# Patient Record
Sex: Male | Born: 1980 | Race: White | Hispanic: No | Marital: Married | Smoking: Never smoker
Health system: Southern US, Community
[De-identification: ages and names within clinical notes are randomized; demographics above are authoritative.]

## PROBLEM LIST (undated history)

## (undated) DIAGNOSIS — K219 Gastro-esophageal reflux disease without esophagitis: Secondary | ICD-10-CM

## (undated) DIAGNOSIS — E785 Hyperlipidemia, unspecified: Secondary | ICD-10-CM

## (undated) HISTORY — PX: STAPEDECTOMY: SHX2435

## (undated) HISTORY — DX: Hyperlipidemia, unspecified: E78.5

## (undated) HISTORY — DX: Gastro-esophageal reflux disease without esophagitis: K21.9

## (undated) HISTORY — PX: HERNIA REPAIR: SHX51

## (undated) HISTORY — PX: WISDOM TOOTH EXTRACTION: SHX21

---

## 2017-10-17 ENCOUNTER — Ambulatory Visit: Payer: Self-pay | Admitting: Family Medicine

## 2017-12-13 DIAGNOSIS — H73892 Other specified disorders of tympanic membrane, left ear: Secondary | ICD-10-CM | POA: Insufficient documentation

## 2017-12-13 DIAGNOSIS — H9012 Conductive hearing loss, unilateral, left ear, with unrestricted hearing on the contralateral side: Secondary | ICD-10-CM | POA: Insufficient documentation

## 2017-12-13 HISTORY — DX: Other specified disorders of tympanic membrane, left ear: H73.892

## 2017-12-13 HISTORY — DX: Conductive hearing loss, unilateral, left ear, with unrestricted hearing on the contralateral side: H90.12

## 2019-02-08 DIAGNOSIS — H9072 Mixed conductive and sensorineural hearing loss, unilateral, left ear, with unrestricted hearing on the contralateral side: Secondary | ICD-10-CM

## 2019-02-08 HISTORY — DX: Mixed conductive and sensorineural hearing loss, unilateral, left ear, with unrestricted hearing on the contralateral side: H90.72

## 2019-09-04 ENCOUNTER — Other Ambulatory Visit: Payer: Self-pay

## 2019-09-05 ENCOUNTER — Ambulatory Visit (INDEPENDENT_AMBULATORY_CARE_PROVIDER_SITE_OTHER): Payer: No Typology Code available for payment source

## 2019-09-05 ENCOUNTER — Encounter: Payer: Self-pay | Admitting: Cardiology

## 2019-09-05 ENCOUNTER — Other Ambulatory Visit: Payer: Self-pay

## 2019-09-05 ENCOUNTER — Ambulatory Visit (INDEPENDENT_AMBULATORY_CARE_PROVIDER_SITE_OTHER): Payer: No Typology Code available for payment source | Admitting: Cardiology

## 2019-09-05 VITALS — BP 126/90 | HR 94 | Ht 68.0 in | Wt 188.0 lb

## 2019-09-05 DIAGNOSIS — R42 Dizziness and giddiness: Secondary | ICD-10-CM

## 2019-09-05 DIAGNOSIS — E782 Mixed hyperlipidemia: Secondary | ICD-10-CM

## 2019-09-05 DIAGNOSIS — I498 Other specified cardiac arrhythmias: Secondary | ICD-10-CM

## 2019-09-05 DIAGNOSIS — R002 Palpitations: Secondary | ICD-10-CM

## 2019-09-05 DIAGNOSIS — R079 Chest pain, unspecified: Secondary | ICD-10-CM

## 2019-09-05 MED ORDER — METOPROLOL TARTRATE 100 MG PO TABS
100.0000 mg | ORAL_TABLET | Freq: Once | ORAL | 0 refills | Status: DC
Start: 2019-09-05 — End: 2019-10-03

## 2019-09-05 NOTE — Patient Instructions (Signed)
Medication Instructions:  Your physician recommends that you continue on your current medications as directed. Please refer to the Current Medication list given to you today.  *If you need a refill on your cardiac medications before your next appointment, please call your pharmacy*   Lab Work: None.  If you have labs (blood work) drawn today and your tests are completely normal, you will receive your results only by: Marland Kitchen MyChart Message (if you have MyChart) OR . A paper copy in the mail If you have any lab test that is abnormal or we need to change your treatment, we will call you to review the results.   Testing/Procedures: Your physician has requested that you have an echocardiogram. Echocardiography is a painless test that uses sound waves to create images of your heart. It provides your doctor with information about the size and shape of your heart and how well your heart's chambers and valves are working. This procedure takes approximately one hour. There are no restrictions for this procedure.  A zio monitor was ordered today. It will remain on for 14 days. You will then return monitor and event diary in provided box. It takes 1-2 weeks for report to be downloaded and returned to [place]. We will call you with the results. If monitor falls off or has orange flashing light, please call Zio for further instructions.   Your cardiac CT will be scheduled at one of the below locations:   [facility] [address] [place], [place] [address] [phone]  OR  [facility] [address] [place], [place] [address] [phone]  If scheduled at [facility], please arrive at the [facility] main entrance of [facility] 30 minutes prior to test start time. Proceed to the [facility] Radiology Department (first floor) to check-in and test prep.  If scheduled at [facility], please arrive 15  mins early for check-in and test prep.  Please follow these instructions carefully (unless otherwise directed):  Hold all erectile dysfunction medications at least 3 days (72 hrs) prior to test.  On the Night Before the Test: . Be sure to Drink plenty of water. . Do not consume any caffeinated/decaffeinated beverages or chocolate 12 hours prior to your test. . Do not take any antihistamines 12 hours prior to your test.   On the Day of the Test: . Drink plenty of water. Do not drink any water within one hour of the test. . Do not eat any food 4 hours prior to the test. . You may take your regular medications prior to the test.  . Take metoprolol (Lopressor) two hours prior to test. .  .          -Drink plenty of water       -Take metoprolol (Lopressor) 2 hours prior to test (if applicable).                        After the Test: . Drink plenty of water. . After receiving IV contrast, you may experience a mild flushed feeling. This is normal. . On occasion, you may experience a mild rash up to 24 hours after the test. This is not dangerous. If this occurs, you can take Benadryl 25 mg and increase your fluid intake. . If you experience trouble breathing, this can be serious. If it is severe call 911 IMMEDIATELY. If it is mild, please call our office. Marland Kitchen  If you take any of these medications: Glipizide/Metformin, Avandament, Glucavance, please do not take 48 hours after completing test unless otherwise instructed.   Once we have confirmed authorization from your insurance company, we will call you to set up a date and time for your test.   For non-scheduling related questions, please contact the cardiac imaging nurse navigator should you have any questions/concerns: Marchia Bond, RN Navigator Cardiac Imaging [facility] [phone] office  For scheduling needs, including cancellations and rescheduling, please call [phone].      Follow-Up: At  [facility], you and your health needs are our priority.  As part of our continuing mission to provide you with exceptional heart care, we have created designated Provider Care Teams.  These Care Teams include your primary Cardiologist (physician) and Advanced Practice Providers (APPs -  Physician Assistants and Nurse Practitioners) who all work together to provide you with the care you need, when you need it.  We recommend signing up for the patient portal called "MyChart".  Sign up information is provided on this After Visit Summary.  MyChart is used to connect with patients for Virtual Visits (Telemedicine).  Patients are able to view lab/test results, encounter notes, upcoming appointments, etc.  Non-urgent messages can be sent to your provider as well.   To learn more about what you can do with MyChart, go to [email or web address].    Your next appointment:   3 month(s)  The format for your next appointment:   In Person  Provider:   Berniece Salines, DO   Other Instructions   Cardiac CT Angiogram A cardiac CT angiogram is a procedure to look at the heart and the area around the heart. It may be done to help find the cause of chest pains or other symptoms of heart disease. During this procedure, a substance called contrast dye is injected into the blood vessels in the area to be checked. A large X-ray machine, called a CT scanner, then takes detailed pictures of the heart and the surrounding area. The procedure is also sometimes called a coronary CT angiogram, coronary artery scanning, or CTA. A cardiac CT angiogram allows the health care provider to see how well blood is flowing to and from the heart. The health care provider will be able to see if there are any problems, such as:  Blockage or narrowing of the coronary arteries in the heart.  Fluid around the heart.  Signs of weakness or disease in the muscles, valves, and tissues of the heart. Tell a health care provider  about:  Any allergies you have. This is especially important if you have had a previous allergic reaction to contrast dye.  All medicines you are taking, including vitamins, herbs, eye drops, creams, and over-the-counter medicines.  Any blood disorders you have.  Any surgeries you have had.  Any medical conditions you have.  Whether you are pregnant or may be pregnant.  Any anxiety disorders, chronic pain, or other conditions you have that may increase your stress or prevent you from lying still. What are the risks? Generally, this is a safe procedure. However, problems may occur, including:  Bleeding.  Infection.  Allergic reactions to medicines or dyes.  Damage to other structures or organs.  Kidney damage from the contrast dye that is used.  Increased risk of cancer from radiation exposure. This risk is low. Talk with your health care provider about: ? The risks and benefits of testing. ? How you can receive the  lowest dose of radiation. What happens before the procedure?  Wear comfortable clothing and remove any jewelry, glasses, dentures, and hearing aids.  Follow instructions from your health care provider about eating and drinking. This may include: ? For 12 hours before the procedure -- avoid caffeine. This includes tea, coffee, soda, energy drinks, and diet pills. Drink plenty of water or other fluids that do not have caffeine in them. Being well hydrated can prevent complications. ? For 4-6 hours before the procedure -- stop eating and drinking. The contrast dye can cause nausea, but this is less likely if your stomach is empty.  Ask your health care provider about changing or stopping your regular medicines. This is especially important if you are taking diabetes medicines, blood thinners, or medicines to treat problems with erections (erectile dysfunction). What happens during the procedure?   Hair on your chest may need to be removed so that small sticky  patches called electrodes can be placed on your chest. These will transmit information that helps to monitor your heart during the procedure.  An IV will be inserted into one of your veins.  You might be given a medicine to control your heart rate during the procedure. This will help to ensure that good images are obtained.  You will be asked to lie on an exam table. This table will slide in and out of the CT machine during the procedure.  Contrast dye will be injected into the IV. You might feel warm, or you may get a metallic taste in your mouth.  You will be given a medicine called nitroglycerin. This will relax or dilate the arteries in your heart.  The table that you are lying on will move into the CT machine tunnel for the scan.  The person running the machine will give you instructions while the scans are being done. You may be asked to: ? Keep your arms above your head. ? Hold your breath. ? Stay very still, even if the table is moving.  When the scanning is complete, you will be moved out of the machine.  The IV will be removed. The procedure may vary among health care providers and hospitals. What can I expect after the procedure? After your procedure, it is common to have:  A metallic taste in your mouth from the contrast dye.  A feeling of warmth.  A headache from the nitroglycerin. Follow these instructions at home:  Take over-the-counter and prescription medicines only as told by your health care provider.  If you are told, drink enough fluid to keep your urine pale yellow. This will help to flush the contrast dye out of your body.  Most people can return to their normal activities right after the procedure. Ask your health care provider what activities are safe for you.  It is up to you to get the results of your procedure. Ask your health care provider, or the department that is doing the procedure, when your results will be ready.  Keep all follow-up visits  as told by your health care provider. This is important. Contact a health care provider if:  You have any symptoms of allergy to the contrast dye. These include: ? Shortness of breath. ? Rash or hives. ? A racing heartbeat. Summary  A cardiac CT angiogram is a procedure to look at the heart and the area around the heart. It may be done to help find the cause of chest pains or other symptoms of heart disease.  During  this procedure, a large X-ray machine, called a CT scanner, takes detailed pictures of the heart and the surrounding area after a contrast dye has been injected into blood vessels in the area.  Ask your health care provider about changing or stopping your regular medicines before the procedure. This is especially important if you are taking diabetes medicines, blood thinners, or medicines to treat erectile dysfunction.  If you are told, drink enough fluid to keep your urine pale yellow. This will help to flush the contrast dye out of your body. This information is not intended to replace advice given to you by your health care provider. Make sure you discuss any questions you have with your health care provider. Document Revised: 12/20/2018 Document Reviewed: 12/20/2018 Elsevier Patient Education  2020 [employer].   Echocardiogram An echocardiogram is a procedure that uses painless sound waves (ultrasound) to produce an image of the heart. Images from an echocardiogram can provide important information about:  Signs of coronary artery disease (CAD).  Aneurysm detection. An aneurysm is a weak or damaged part of an artery wall that bulges out from the normal force of blood pumping through the body.  Heart size and shape. Changes in the size or shape of the heart can be associated with certain conditions, including heart failure, aneurysm, and CAD.  Heart muscle function.  Heart valve function.  Signs of a past heart attack.  Fluid buildup around the  heart.  Thickening of the heart muscle.  A tumor or infectious growth around the heart valves. Tell a health care provider about:  Any allergies you have.  All medicines you are taking, including vitamins, herbs, eye drops, creams, and over-the-counter medicines.  Any blood disorders you have.  Any surgeries you have had.  Any medical conditions you have.  Whether you are pregnant or may be pregnant. What are the risks? Generally, this is a safe procedure. However, problems may occur, including:  Allergic reaction to dye (contrast) that may be used during the procedure. What happens before the procedure? No specific preparation is needed. You may eat and drink normally. What happens during the procedure?   An IV tube may be inserted into one of your veins.  You may receive contrast through this tube. A contrast is an injection that improves the quality of the pictures from your heart.  A gel will be applied to your chest.  A wand-like tool (transducer) will be moved over your chest. The gel will help to transmit the sound waves from the transducer.  The sound waves will harmlessly bounce off of your heart to allow the heart images to be captured in real-time motion. The images will be recorded on a computer. The procedure may vary among health care providers and hospitals. What happens after the procedure?  You may return to your normal, everyday life, including diet, activities, and medicines, unless your health care provider tells you not to do that. Summary  An echocardiogram is a procedure that uses painless sound waves (ultrasound) to produce an image of the heart.  Images from an echocardiogram can provide important information about the size and shape of your heart, heart muscle function, heart valve function, and fluid buildup around your heart.  You do not need to do anything to prepare before this procedure. You may eat and drink normally.  After the  echocardiogram is completed, you may return to your normal, everyday life, unless your health care provider tells you not to do that. This information  is not intended to replace advice given to you by your health care provider. Make sure you discuss any questions you have with your health care provider. Document Revised: 08/17/2018 Document Reviewed: 05/29/2016 Elsevier Patient Education  [employer].

## 2019-09-05 NOTE — Progress Notes (Signed)
Cardiology Office Note:    Date:  09/05/2019   ID:  Edward Lindsey, DOB 1980/11/07, MRN [id number]  PCP:  Joycelyn RuaMeyers, Stephen, MD  Cardiologist:  Thomasene RippleKardie Annabelle Rexroad, DO  Electrophysiologist:  None   Referring MD: Joycelyn RuaMeyers, Stephen, MD   Chief Complaint  Patient presents with  . New Patient (Initial Visit)   History of Present Illness:    Edward AidRobert Briner is a 39 y.o. male with a hx of hyperlipidemia, GERD, significant family history of premature coronary artery disease presents today to be evaluated for chest pain, palpitations and near syncope episodes.  The patient tells me for many years he has been experiencing episodes of near syncope/lightheadedness.  He describes it as a sensation of lightheadedness where he feels flushed.  He has never had any syncope/passing out events.  He notes that once this occurs it last for about 5 seconds and resolves.  However he tells me 1 week ago he had an episode which was significantly different.  Although he was lightheaded with a flush sensation this time it lasted longer and he did have feelings of significant palpitations.  He noted that the lightheadedness and palpitations went through all day and resolve later that evening.  He did check his blood pressure during that time and he reported that it was 120/80 mmHg with a heart rate of 106.  In addition, he has been expressing intermittent chest pain.  He described this as a left-sided chest tightness which radiates up to his neck, last for few seconds prior to resolution.  Nothing makes it better or worse.  He denies any associated shortness of breath.  He notes that is been occurring frequently recently.  The patient admits that he is under a lot of stress recently but is also concerned about his wellbeing.  Past Medical History:  Diagnosis Date  . Conductive hearing loss of left ear with unrestricted hearing of right ear 12/13/2017   Last Assessment & Plan:  Formatting of this note might be different from the  original. Concern over his left ear. 1 week history of slowly worsening hearing in the left ear.  No history of ear surgery, infection or trauma.  He had an audiogram done last year that is reviewed and shows a conductive hearing loss of severe nature primarily in the low frequencies.  Along with this, he gets an intermit  . GERD (gastroesophageal reflux disease)   . Hyperlipidemia   . 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 HERNIA REPAIR    . STAPEDECTOMY Left   . WISDOM TOOTH EXTRACTION      Current Medications: Current Meds  Medication Sig  . famotidine (PEPCID) 20 MG tablet Take 20 mg by mouth 2 (two) times daily.     Allergies:   Codeine   Social History   Socioeconomic History  . Marital status: Married    Spouse name: Not on file  . Number of children: Not on file  . Years of education: Not on file  . Highest education level: Not on file  Occupational History  . Not on file  Tobacco Use  . Smoking status: Never Smoker  . Smokeless tobacco: Never Used  Substance and Sexual Activity  . Alcohol use: Yes    Comment: Drinks alcohol daily  . Drug use: Not on file  .  Sexual activity: Not on file  Other Topics Concern  . Not on file  Social History Narrative  . Not on file   Social Determinants of Health   Financial Resource Strain:   . Difficulty of Paying Living Expenses:   Food Insecurity:   . Worried About Programme researcher, broadcasting/film/video in the Last Year:   . Barista in the Last Year:   Transportation Needs:   . Freight forwarder (Medical):   Marland Kitchen Lack of Transportation (Non-Medical):   Physical Activity:   . Days of Exercise per Week:   . Minutes of Exercise per Session:   Stress:   . Feeling of Stress :   Social Connections:   . Frequency of Communication with Friends  and Family:   . Frequency of Social Gatherings with Friends and Family:   . Attends Religious Services:   . Active Member of Clubs or Organizations:   . Attends Banker Meetings:   Marland Kitchen Marital Status:      Family History: The patient's family history includes Cancer in his father; Heart attack (age of onset: 26) in his maternal grandfather; Melanoma in his father.  ROS:   Review of Systems  Constitution: Negative for decreased appetite, fever and weight gain.  HENT: Negative for congestion, ear discharge, hoarse voice and sore throat.   Eyes: Negative for discharge, redness, vision loss in right eye and visual halos.  Cardiovascular: Reports chest pain, palpitations and lightheadedness.  Negative for  dyspnea on exertion, leg swelling, orthopnea.  Respiratory: Negative for cough, hemoptysis, shortness of breath and snoring.   Endocrine: Negative for heat intolerance and polyphagia.  Hematologic/Lymphatic: Negative for bleeding problem. Does not bruise/bleed easily.  Skin: Negative for flushing, nail changes, rash and suspicious lesions.  Musculoskeletal: Negative for arthritis, joint pain, muscle cramps, myalgias, neck pain and stiffness.  Gastrointestinal: Negative for abdominal pain, bowel incontinence, diarrhea and excessive appetite.  Genitourinary: Negative for decreased libido, genital sores and incomplete emptying.  Neurological: Negative for brief paralysis, focal weakness, headaches and loss of balance.  Psychiatric/Behavioral: Negative for altered mental status, depression and suicidal ideas.  Allergic/Immunologic: Negative for HIV exposure and persistent infections.    EKGs/Labs/Other Studies Reviewed:    The following studies were reviewed today:   EKG:  The ekg ordered today demonstrates sinus rhythm, heart rate 94 bpm with arrhythmia no EKG for comparison.  Recent Labs: Lab work done at his PCP on March 21, 2019 shows Hemoglobin A1c 5.1 Recent Lipid  Panel Lipid profile performed on April 02, 2019 shows total cholesterol 219, triglyceride 80, HDL 60, LDL 143  Physical Exam:    VS:  BP 126/90   Pulse 94   Ht 5\' 8"  (1.727 m)   Wt 188 lb (85.3 kg)   SpO2 99%   BMI 28.59 kg/m     Wt Readings from Last 3 Encounters:  09/05/19 188 lb (85.3 kg)     GEN: Well nourished, well developed in no acute distress HEENT: Normal NECK: No JVD; No carotid bruits LYMPHATICS: No lymphadenopathy CARDIAC: S1S2 noted,RRR, no murmurs, rubs, gallops RESPIRATORY:  Clear to auscultation without rales, wheezing or rhonchi  ABDOMEN: Soft, non-tender, non-distended, +bowel sounds, no guarding. EXTREMITIES: No edema, No cyanosis, no clubbing MUSCULOSKELETAL:  No deformity  SKIN: Warm and dry NEUROLOGIC:  Alert and oriented x 3, non-focal PSYCHIATRIC:  Normal affect, good insight  ASSESSMENT:    1. Chest pain of uncertain etiology   2. Palpitations   3.  Sinus arrhythmia   4. Mixed hyperlipidemia   5. Lightheadedness    PLAN:     1.  His chest pain is concerning given his family history of premature event and his grandfather at 6 as well as with his hyperlipidemia.  At this time I like to pursue an ischemic evaluation in this patient.  His ASCVD risk score is low however I do think he is a appropriate candidate for coronary CTA.  I have educated patient about this testing, he is agreeable to proceed with this testing.  He denies any IV contrast dye allergies.  2.  I would like to rule out a cardiovascular etiology of this palpitation, therefore at this time I would like to placed a zio patch for 14 days. In additon a transthoracic echocardiogram will be ordered to assess LV/RV function and any structural abnormalities. Once these testing have been performed amd reviewed further reccomendations will be made. For now, I do reccomend that the patient goes to the nearest ED if  symptoms recur.  3.  His EKG today show evidence of sinus arrhythmia-I did  educate the patient about why this occurs.  4.  In terms of his lightheadedness educated patient to do some physical cross legs maneuver hopefully will help with peripheral vascular resistance and improve his symptoms.  5.  In terms of his hyperlipidemia he prefers diet modification options.  I did explain to him that this may be familiar and he has noted that his diet is very healthy.  But for right now we will repeat the lipid profile at his next visit.  The patient is in agreement with the above plan. The patient left the office in stable condition.  The patient will follow up in 3 months or sooner if needed.   Medication Adjustments/Labs and Tests Ordered: Current medicines are reviewed at length with the patient today.  Concerns regarding medicines are outlined above.  Orders Placed This Encounter  Procedures  . CT CORONARY MORPH W/CTA COR W/SCORE W/CA W/CM &/OR WO/CM  . CT CORONARY FRACTIONAL FLOW RESERVE DATA PREP  . CT CORONARY FRACTIONAL FLOW RESERVE FLUID ANALYSIS  . LONG TERM MONITOR (3-14 DAYS)  . EKG 12-Lead  . ECHOCARDIOGRAM COMPLETE   Meds ordered this encounter  Medications  . metoprolol tartrate (LOPRESSOR) 100 MG tablet    Sig: Take 1 tablet (100 mg total) by mouth once for 1 dose. Take 2 hours before CT.    Dispense:  1 tablet    Refill:  0    Patient Instructions  Medication Instructions:  Your physician recommends that you continue on your current medications as directed. Please refer to the Current Medication list given to you today.  *If you need a refill on your cardiac medications before your next appointment, please call your pharmacy*   Lab Work: None.  If you have labs (blood work) drawn today and your tests are completely normal, you will receive your results only by: Marland Kitchen MyChart Message (if you have MyChart) OR . A paper copy in the mail If you have any lab test that is abnormal or we need to change your treatment, we will call you to review the  results.   Testing/Procedures: Your physician has requested that you have an echocardiogram. Echocardiography is a painless test that uses sound waves to create images of your heart. It provides your doctor with information about the size and shape of your heart and how well your heart's chambers and valves are working. This  procedure takes approximately one hour. There are no restrictions for this procedure.  A zio monitor was ordered today. It will remain on for 14 days. You will then return monitor and event diary in provided box. It takes 1-2 weeks for report to be downloaded and returned to [place]. We will call you with the results. If monitor falls off or has orange flashing light, please call Zio for further instructions.   Your cardiac CT will be scheduled at one of the below locations:   [facility] [address] [place], [place] [address] [phone]  OR  [facility] [address] [place], [place] [address] [phone]  If scheduled at [facility], please arrive at the [facility] main entrance of [facility] 30 minutes prior to test start time. Proceed to the [facility] Radiology Department (first floor) to check-in and test prep.  If scheduled at [facility], please arrive 15 mins early for check-in and test prep.  Please follow these instructions carefully (unless otherwise directed):  Hold all erectile dysfunction medications at least 3 days (72 hrs) prior to test.  On the Night Before the Test: . Be sure to Drink plenty of water. . Do not consume any caffeinated/decaffeinated beverages or chocolate 12 hours prior to your test. . Do not take any antihistamines 12 hours prior to your test.   On the Day of the Test: . Drink plenty of water. Do not drink any water within one hour of the test. . Do not eat any food 4 hours prior to the test. . You may take your  regular medications prior to the test.  . Take metoprolol (Lopressor) two hours prior to test. .  .          -Drink plenty of water       -Take metoprolol (Lopressor) 2 hours prior to test (if applicable).                        After the Test: . Drink plenty of water. . After receiving IV contrast, you may experience a mild flushed feeling. This is normal. . On occasion, you may experience a mild rash up to 24 hours after the test. This is not dangerous. If this occurs, you can take Benadryl 25 mg and increase your fluid intake. . If you experience trouble breathing, this can be serious. If it is severe call 911 IMMEDIATELY. If it is mild, please call our office. . If you take any of these medications: Glipizide/Metformin, Avandament, Glucavance, please do not take 48 hours after completing test unless otherwise instructed.   Once we have confirmed authorization from your insurance company, we will call you to set up a date and time for your test.   For non-scheduling related questions, please contact the cardiac imaging nurse navigator should you have any questions/concerns: Rockwell Alexandria, RN Navigator Cardiac Imaging [facility] [phone] office  For scheduling needs, including cancellations and rescheduling, please call [phone].      Follow-Up: At [facility], you and your health needs are our priority.  As part of our continuing mission to provide you with exceptional heart care, we have created designated Provider Care Teams.  These Care Teams include your primary Cardiologist (physician) and Advanced Practice Providers (APPs -  Physician Assistants and Nurse Practitioners) who all work together to provide you with the care you need, when  you need it.  We recommend signing up for the patient portal called "MyChart".  Sign up information is provided on this After Visit Summary.  MyChart is used to connect with patients for Virtual Visits  (Telemedicine).  Patients are able to view lab/test results, encounter notes, upcoming appointments, etc.  Non-urgent messages can be sent to your provider as well.   To learn more about what you can do with MyChart, go to [email or web address].    Your next appointment:   3 month(s)  The format for your next appointment:   In Person  Provider:   Thomasene Ripple, DO   Other Instructions   Cardiac CT Angiogram A cardiac CT angiogram is a procedure to look at the heart and the area around the heart. It may be done to help find the cause of chest pains or other symptoms of heart disease. During this procedure, a substance called contrast dye is injected into the blood vessels in the area to be checked. A large X-ray machine, called a CT scanner, then takes detailed pictures of the heart and the surrounding area. The procedure is also sometimes called a coronary CT angiogram, coronary artery scanning, or CTA. A cardiac CT angiogram allows the health care provider to see how well blood is flowing to and from the heart. The health care provider will be able to see if there are any problems, such as:  Blockage or narrowing of the coronary arteries in the heart.  Fluid around the heart.  Signs of weakness or disease in the muscles, valves, and tissues of the heart. Tell a health care provider about:  Any allergies you have. This is especially important if you have had a previous allergic reaction to contrast dye.  All medicines you are taking, including vitamins, herbs, eye drops, creams, and over-the-counter medicines.  Any blood disorders you have.  Any surgeries you have had.  Any medical conditions you have.  Whether you are pregnant or may be pregnant.  Any anxiety disorders, chronic pain, or other conditions you have that may increase your stress or prevent you from lying still. What are the risks? Generally, this is a safe procedure. However, problems may occur,  including:  Bleeding.  Infection.  Allergic reactions to medicines or dyes.  Damage to other structures or organs.  Kidney damage from the contrast dye that is used.  Increased risk of cancer from radiation exposure. This risk is low. Talk with your health care provider about: ? The risks and benefits of testing. ? How you can receive the lowest dose of radiation. What happens before the procedure?  Wear comfortable clothing and remove any jewelry, glasses, dentures, and hearing aids.  Follow instructions from your health care provider about eating and drinking. This may include: ? For 12 hours before the procedure -- avoid caffeine. This includes tea, coffee, soda, energy drinks, and diet pills. Drink plenty of water or other fluids that do not have caffeine in them. Being well hydrated can prevent complications. ? For 4-6 hours before the procedure -- stop eating and drinking. The contrast dye can cause nausea, but this is less likely if your stomach is empty.  Ask your health care provider about changing or stopping your regular medicines. This is especially important if you are taking diabetes medicines, blood thinners, or medicines to treat problems with erections (erectile dysfunction). What happens during the procedure?   Hair on your chest may need to be removed so that small sticky patches called electrodes  can be placed on your chest. These will transmit information that helps to monitor your heart during the procedure.  An IV will be inserted into one of your veins.  You might be given a medicine to control your heart rate during the procedure. This will help to ensure that good images are obtained.  You will be asked to lie on an exam table. This table will slide in and out of the CT machine during the procedure.  Contrast dye will be injected into the IV. You might feel warm, or you may get a metallic taste in your mouth.  You will be given a medicine called  nitroglycerin. This will relax or dilate the arteries in your heart.  The table that you are lying on will move into the CT machine tunnel for the scan.  The person running the machine will give you instructions while the scans are being done. You may be asked to: ? Keep your arms above your head. ? Hold your breath. ? Stay very still, even if the table is moving.  When the scanning is complete, you will be moved out of the machine.  The IV will be removed. The procedure may vary among health care providers and hospitals. What can I expect after the procedure? After your procedure, it is common to have:  A metallic taste in your mouth from the contrast dye.  A feeling of warmth.  A headache from the nitroglycerin. Follow these instructions at home:  Take over-the-counter and prescription medicines only as told by your health care provider.  If you are told, drink enough fluid to keep your urine pale yellow. This will help to flush the contrast dye out of your body.  Most people can return to their normal activities right after the procedure. Ask your health care provider what activities are safe for you.  It is up to you to get the results of your procedure. Ask your health care provider, or the department that is doing the procedure, when your results will be ready.  Keep all follow-up visits as told by your health care provider. This is important. Contact a health care provider if:  You have any symptoms of allergy to the contrast dye. These include: ? Shortness of breath. ? Rash or hives. ? A racing heartbeat. Summary  A cardiac CT angiogram is a procedure to look at the heart and the area around the heart. It may be done to help find the cause of chest pains or other symptoms of heart disease.  During this procedure, a large X-ray machine, called a CT scanner, takes detailed pictures of the heart and the surrounding area after a contrast dye has been injected into blood  vessels in the area.  Ask your health care provider about changing or stopping your regular medicines before the procedure. This is especially important if you are taking diabetes medicines, blood thinners, or medicines to treat erectile dysfunction.  If you are told, drink enough fluid to keep your urine pale yellow. This will help to flush the contrast dye out of your body. This information is not intended to replace advice given to you by your health care provider. Make sure you discuss any questions you have with your health care provider. Document Revised: 12/20/2018 Document Reviewed: 12/20/2018 Elsevier Patient Education  2020 [employer].   Echocardiogram An echocardiogram is a procedure that uses painless sound waves (ultrasound) to produce an image of the heart. Images from an echocardiogram can provide  important information about:  Signs of coronary artery disease (CAD).  Aneurysm detection. An aneurysm is a weak or damaged part of an artery wall that bulges out from the normal force of blood pumping through the body.  Heart size and shape. Changes in the size or shape of the heart can be associated with certain conditions, including heart failure, aneurysm, and CAD.  Heart muscle function.  Heart valve function.  Signs of a past heart attack.  Fluid buildup around the heart.  Thickening of the heart muscle.  A tumor or infectious growth around the heart valves. Tell a health care provider about:  Any allergies you have.  All medicines you are taking, including vitamins, herbs, eye drops, creams, and over-the-counter medicines.  Any blood disorders you have.  Any surgeries you have had.  Any medical conditions you have.  Whether you are pregnant or may be pregnant. What are the risks? Generally, this is a safe procedure. However, problems may occur, including:  Allergic reaction to dye (contrast) that may be used during the procedure. What happens before  the procedure? No specific preparation is needed. You may eat and drink normally. What happens during the procedure?   An IV tube may be inserted into one of your veins.  You may receive contrast through this tube. A contrast is an injection that improves the quality of the pictures from your heart.  A gel will be applied to your chest.  A wand-like tool (transducer) will be moved over your chest. The gel will help to transmit the sound waves from the transducer.  The sound waves will harmlessly bounce off of your heart to allow the heart images to be captured in real-time motion. The images will be recorded on a computer. The procedure may vary among health care providers and hospitals. What happens after the procedure?  You may return to your normal, everyday life, including diet, activities, and medicines, unless your health care provider tells you not to do that. Summary  An echocardiogram is a procedure that uses painless sound waves (ultrasound) to produce an image of the heart.  Images from an echocardiogram can provide important information about the size and shape of your heart, heart muscle function, heart valve function, and fluid buildup around your heart.  You do not need to do anything to prepare before this procedure. You may eat and drink normally.  After the echocardiogram is completed, you may return to your normal, everyday life, unless your health care provider tells you not to do that. This information is not intended to replace advice given to you by your health care provider. Make sure you discuss any questions you have with your health care provider. Document Revised: 08/17/2018 Document Reviewed: 05/29/2016 Elsevier Patient Education  2020 [employer].     Adopting a Healthy Lifestyle.  Know what a healthy weight is for you (roughly BMI <25) and aim to maintain this   Aim for 7+ servings of fruits and vegetables daily   65-80+ fluid ounces of water or  unsweet tea for healthy kidneys   Limit to max 1 drink of alcohol per day; avoid smoking/tobacco   Limit animal fats in diet for cholesterol and heart health - choose grass fed whenever available   Avoid highly processed foods, and foods high in saturated/trans fats   Aim for low stress - take time to unwind and care for your mental health   Aim for 150 min of moderate intensity exercise weekly for heart  health, and weights twice weekly for bone health   Aim for 7-9 hours of sleep daily   When it comes to diets, agreement about the perfect plan isnt easy to find, even among the experts. Experts at the [facility] [employer] developed an idea known as the Healthy Eating Plate. Just imagine a plate divided into logical, healthy portions.   The emphasis is on diet quality:   Load up on vegetables and fruits - one-half of your plate: Aim for color and variety, and remember that potatoes dont count.   Go for whole grains - one-quarter of your plate: Whole wheat, barley, wheat berries, quinoa, oats, brown rice, and foods made with them. If you want pasta, go with whole wheat pasta.   Protein power - one-quarter of your plate: Fish, chicken, beans, and nuts are all healthy, versatile protein sources. Limit red meat.   The diet, however, does go beyond the plate, offering a few other suggestions.   Use healthy plant oils, such as olive, canola, soy, corn, sunflower and peanut. Check the labels, and avoid partially hydrogenated oil, which have unhealthy trans fats.   If youre thirsty, drink water. Coffee and tea are good in moderation, but skip sugary drinks and limit milk and dairy products to one or two daily servings.   The type of carbohydrate in the diet is more important than the amount. Some sources of carbohydrates, such as vegetables, fruits, whole grains, and beans-are healthier than others.   Finally, stay active  Signed, Thomasene Ripple, DO  09/05/2019 9:54 AM    [facility]

## 2019-09-10 ENCOUNTER — Other Ambulatory Visit: Payer: Self-pay

## 2019-09-10 ENCOUNTER — Ambulatory Visit (HOSPITAL_BASED_OUTPATIENT_CLINIC_OR_DEPARTMENT_OTHER)
Admission: RE | Admit: 2019-09-10 | Discharge: 2019-09-10 | Disposition: A | Discharge status: Home / Self Care | Payer: No Typology Code available for payment source | Source: Ambulatory Visit | Attending: Cardiology | Admitting: Cardiology

## 2019-09-10 DIAGNOSIS — R079 Chest pain, unspecified: Secondary | ICD-10-CM | POA: Insufficient documentation

## 2019-09-10 DIAGNOSIS — I498 Other specified cardiac arrhythmias: Secondary | ICD-10-CM | POA: Insufficient documentation

## 2019-09-10 DIAGNOSIS — E782 Mixed hyperlipidemia: Secondary | ICD-10-CM | POA: Diagnosis not present

## 2019-09-10 DIAGNOSIS — R002 Palpitations: Secondary | ICD-10-CM | POA: Insufficient documentation

## 2019-09-11 ENCOUNTER — Telehealth: Payer: Self-pay

## 2019-09-11 NOTE — Telephone Encounter (Signed)
Patient returning call. Transferred call to the nurse. 

## 2019-09-11 NOTE — Telephone Encounter (Signed)
Left message on patients voicemail to please return our call.   

## 2019-09-11 NOTE — Telephone Encounter (Signed)
-----   Message from Thomasene Ripple, DO sent at 09/11/2019  1:44 PM EDT ----- Normal echo.  Please forward copy to the patient's PCP.

## 2019-09-11 NOTE — Telephone Encounter (Signed)
Spoke with patient regarding results and recommendation.  Patient verbalizes understanding and is agreeable to plan of care. Advised patient to call back with any issues or concerns.  

## 2019-10-02 ENCOUNTER — Telehealth (HOSPITAL_COMMUNITY): Payer: Self-pay | Admitting: *Deleted

## 2019-10-02 NOTE — Telephone Encounter (Signed)
Attempted to call patient regarding upcoming cardiac CT appointment. Left message on voicemail with name and callback number  Elexius Minar Tai RN Navigator Cardiac Imaging [facility] [phone] Office [phone] Cell  

## 2019-10-03 ENCOUNTER — Telehealth (HOSPITAL_COMMUNITY): Payer: Self-pay | Admitting: *Deleted

## 2019-10-03 ENCOUNTER — Other Ambulatory Visit (HOSPITAL_COMMUNITY): Payer: Self-pay | Admitting: *Deleted

## 2019-10-03 MED ORDER — METOPROLOL TARTRATE 100 MG PO TABS: 100.0000 mg | ORAL_TABLET | Freq: Once | ORAL | 0 refills | Status: DC

## 2019-10-03 NOTE — Telephone Encounter (Signed)
Reaching out to patient to offer assistance regarding upcoming cardiac imaging study; pt verbalizes understanding of appt date/time, parking situation and where to check in, pre-test NPO status and medications ordered, and verified current allergies; name and call back number provided for further questions should they arise  Mayci Haning Tai RN Navigator Cardiac Imaging [facility] [phone] office [phone] cell 

## 2019-10-04 ENCOUNTER — Ambulatory Visit (HOSPITAL_COMMUNITY)
Admission: RE | Admit: 2019-10-04 | Discharge: 2019-10-04 | Disposition: A | Discharge status: Home / Self Care | Payer: No Typology Code available for payment source | Source: Ambulatory Visit | Attending: Cardiology | Admitting: Cardiology

## 2019-10-04 ENCOUNTER — Other Ambulatory Visit: Payer: Self-pay

## 2019-10-04 DIAGNOSIS — R079 Chest pain, unspecified: Secondary | ICD-10-CM | POA: Insufficient documentation

## 2019-10-04 DIAGNOSIS — R002 Palpitations: Secondary | ICD-10-CM | POA: Diagnosis present

## 2019-10-04 DIAGNOSIS — I498 Other specified cardiac arrhythmias: Secondary | ICD-10-CM | POA: Insufficient documentation

## 2019-10-04 DIAGNOSIS — E782 Mixed hyperlipidemia: Secondary | ICD-10-CM | POA: Insufficient documentation

## 2019-10-04 MED ORDER — NITROGLYCERIN 0.4 MG SL SUBL
SUBLINGUAL_TABLET | SUBLINGUAL | Status: AC
  Filled 2019-10-04: qty 2

## 2019-10-04 MED ORDER — IOHEXOL 350 MG/ML SOLN
80.0000 mL | Freq: Once | INTRAVENOUS | Status: AC | PRN
  Administered 2019-10-04: 80 mL via INTRAVENOUS

## 2019-10-04 MED ORDER — NITROGLYCERIN 0.4 MG SL SUBL
0.8000 mg | SUBLINGUAL_TABLET | Freq: Once | SUBLINGUAL | Status: AC
  Administered 2019-10-04: 0.8 mg via SUBLINGUAL

## 2019-10-05 ENCOUNTER — Telehealth: Payer: Self-pay

## 2019-10-05 NOTE — Telephone Encounter (Signed)
Spoke with patient regarding results.  Patient verbalizes understanding and is agreeable to plan of care. Advised patient to call back with any issues or concerns.  

## 2019-10-05 NOTE — Telephone Encounter (Signed)
-----   Message from Thomasene Ripple, DO sent at 10/05/2019 12:02 AM EDT ----- CT coronary normal.  Good result

## 2019-10-11 ENCOUNTER — Telehealth: Payer: Self-pay

## 2019-10-11 NOTE — Telephone Encounter (Signed)
Follow up   Patient is returning call for test results. Please call. 

## 2019-10-11 NOTE — Telephone Encounter (Signed)
-----   Message from Edward Ripple, DO sent at 10/10/2019  5:30 PM EDT ----- Randie Heinz news, normal study

## 2019-10-11 NOTE — Telephone Encounter (Signed)
Spoke with patient regarding results.  Patient verbalizes understanding and is agreeable to plan of care. Advised patient to call back with any issues or concerns.  

## 2019-10-11 NOTE — Telephone Encounter (Signed)
Left message on patients voicemail to please return our call.   

## 2019-12-18 ENCOUNTER — Ambulatory Visit: Payer: No Typology Code available for payment source | Admitting: Cardiology

## 2020-01-16 ENCOUNTER — Encounter: Payer: Self-pay | Admitting: Cardiology

## 2020-01-16 ENCOUNTER — Other Ambulatory Visit: Payer: Self-pay

## 2020-01-16 ENCOUNTER — Ambulatory Visit (INDEPENDENT_AMBULATORY_CARE_PROVIDER_SITE_OTHER): Payer: No Typology Code available for payment source | Admitting: Cardiology

## 2020-01-16 VITALS — BP 128/78 | HR 92 | Ht 68.0 in | Wt 191.8 lb

## 2020-01-16 DIAGNOSIS — E782 Mixed hyperlipidemia: Secondary | ICD-10-CM | POA: Diagnosis not present

## 2020-01-16 DIAGNOSIS — R42 Dizziness and giddiness: Secondary | ICD-10-CM | POA: Diagnosis not present

## 2020-01-16 DIAGNOSIS — I498 Other specified cardiac arrhythmias: Secondary | ICD-10-CM

## 2020-01-16 NOTE — Patient Instructions (Signed)

## 2020-01-16 NOTE — Progress Notes (Signed)
Cardiology Office Note:    Date:  01/16/2020   ID:  Edward Lindsey, DOB August 07, 1980, MRN [id number]  PCP:  Joycelyn Rua, MD  Cardiologist:  Thomasene Ripple, DO  Electrophysiologist:  None   Referring MD: Joycelyn Rua, MD   " I am doing fine"  History of Present Illness:    Edward Lindsey is a 39 y.o. male with a hx of hyperlipidemia, GERD presented initially to be evaluated for chest pain palpitations and near syncope.  Did see the patient on September 05, 2019 at that time I recommend he undergo coronary CTA, where a Zio patch and get echocardiogram for complete evaluation.  The patient was able to get his testing done.  He is here today for follow-up visit.  He offers no complaints at this time.  Past Medical History:  Diagnosis Date  . Conductive hearing loss of left ear with unrestricted hearing of right ear 12/13/2017   Last Assessment & Plan:  Formatting of this note might be different from the original. Concern over his left ear. 1 week history of slowly worsening hearing in the left ear.  No history of ear surgery, infection or trauma.  He had an audiogram done last year that is reviewed and shows a conductive hearing loss of severe nature primarily in the low frequencies.  Along with this, he gets an intermit  . GERD (gastroesophageal reflux disease)   . Hyperlipidemia   . 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 HERNIA REPAIR    . STAPEDECTOMY Left   . WISDOM TOOTH EXTRACTION      Current Medications: Current Meds  Medication Sig  . famotidine (PEPCID) 20 MG tablet Take 20 mg by mouth 2 (two) times daily.     Allergies:   Codeine   Social History   Socioeconomic History  . Marital status: Married    Spouse name: Not on file  . Number of children: Not on file  . Years of education: Not  on file  . Highest education level: Not on file  Occupational History  . Not on file  Tobacco Use  . Smoking status: Never Smoker  . Smokeless tobacco: Never Used  Substance and Sexual Activity  . Alcohol use: Yes    Comment: Drinks alcohol daily  . Drug use: Not on file  . Sexual activity: Not on file  Other Topics Concern  . Not on file  Social History Narrative  . Not on file   Social Determinants of Health   Financial Resource Strain:   . Difficulty of Paying Living Expenses: Not on file  Food Insecurity:   . Worried About Programme researcher, broadcasting/film/video in the Last Year: Not on file  . Ran Out of Food in the Last Year: Not on file  Transportation Needs:   . Lack of Transportation (Medical): Not on file  . Lack of Transportation (Non-Medical): Not on file  Physical Activity:   . Days of Exercise per Week: Not on file  . Minutes of Exercise per Session: Not on file  Stress:   . Feeling of Stress : Not on file  Social Connections:   . Frequency of Communication with Friends and Family: Not on file  . Frequency of Social Gatherings with Friends and Family: Not on file  . Attends Religious Services:  Not on file  . Active Member of Clubs or Organizations: Not on file  . Attends BankerClub or Organization Meetings: Not on file  . Marital Status: Not on file     Family History: The patient's family history includes Cancer in his father; Heart attack (age of onset: 4130) in his maternal grandfather; Melanoma in his father.  ROS:   Review of Systems  Constitution: Negative for decreased appetite, fever and weight gain.  HENT: Negative for congestion, ear discharge, hoarse voice and sore throat.   Eyes: Negative for discharge, redness, vision loss in right eye and visual halos.  Cardiovascular: Negative for chest pain, dyspnea on exertion, leg swelling, orthopnea and palpitations.  Respiratory: Negative for cough, hemoptysis, shortness of breath and snoring.   Endocrine: Negative for heat  intolerance and polyphagia.  Hematologic/Lymphatic: Negative for bleeding problem. Does not bruise/bleed easily.  Skin: Negative for flushing, nail changes, rash and suspicious lesions.  Musculoskeletal: Negative for arthritis, joint pain, muscle cramps, myalgias, neck pain and stiffness.  Gastrointestinal: Negative for abdominal pain, bowel incontinence, diarrhea and excessive appetite.  Genitourinary: Negative for decreased libido, genital sores and incomplete emptying.  Neurological: Negative for brief paralysis, focal weakness, headaches and loss of balance.  Psychiatric/Behavioral: Negative for altered mental status, depression and suicidal ideas.  Allergic/Immunologic: Negative for HIV exposure and persistent infections.    EKGs/Labs/Other Studies Reviewed:    The following studies were reviewed today:   EKG:  The ekg ordered today demonstrates   Zio MOnitor The patient wore the monitor for 14 days starting September 05, 2019. Indication: Paroxysmal tachycardia. The minimum heart rate was 51 bpm, maximum heart rate was 172 bpm, and average heart rate was 88 bpm.  Predominant underlying rhythm was Sinus Rhythm.  Premature atrial complexes were rare (<1.0%).  Premature Ventricular complexes were rare (<1.0%).  No ventricular tachycardia, No pauses, No AV block, No supraventricular tachycardia and no atrial fibrillation present.  16 patient triggered events 1 associated with premature ventricular tachycardia and the remaining associated with sinus rhythm. 4 diary events all associated with sinus rhythm.  Conclusion: Unremarkable/normal study.   Echo IMPRESSIONS  1. Left ventricular ejection fraction, by estimation, is 60 to 65%. The left ventricle has normal function. The left ventricle has no regional wall motion abnormalities.  2. Right ventricular systolic function is normal. The right ventricular size is normal.  3. The mitral valve is normal in structure. No evidence  of mitral valve  regurgitation. No evidence of mitral stenosis.   CCTA  IMPRESSION: Oct 04, 2019. 1. Coronary calcium score of 0. This was 0 percentile for age and sex matched control.  2. Normal coronary origin with right dominance.  3. No evidence of CAD.  Clemence Lengyel, DO  Recent Labs: No results found for requested labs within last 8760 hours.  Recent Lipid Panel No results found for: CHOL, TRIG, HDL, CHOLHDL, VLDL, LDLCALC, LDLDIRECT  Physical Exam:    VS:  BP 128/78 (BP Location: Right Arm, Patient Position: Sitting, Cuff Size: Normal)   Pulse 92   Ht 5\' 8"  (1.727 m)   Wt 191 lb 12.8 oz (87 kg)   SpO2 96%   BMI 29.16 kg/m     Wt Readings from Last 3 Encounters:  01/16/20 191 lb 12.8 oz (87 kg)  09/05/19 188 lb (85.3 kg)     GEN: Well nourished, well developed in no acute distress HEENT: Normal NECK: No JVD; No carotid bruits LYMPHATICS: No lymphadenopathy CARDIAC: S1S2 noted,RRR, no murmurs,  rubs, gallops RESPIRATORY:  Clear to auscultation without rales, wheezing or rhonchi  ABDOMEN: Soft, non-tender, non-distended, +bowel sounds, no guarding. EXTREMITIES: No edema, No cyanosis, no clubbing MUSCULOSKELETAL:  No deformity  SKIN: Warm and dry NEUROLOGIC:  Alert and oriented x 3, non-focal PSYCHIATRIC:  Normal affect, good insight  ASSESSMENT:    1. Sinus arrhythmia   2. Mixed hyperlipidemia   3. Dizziness    PLAN:     He appears to be doing well from a cardiovascular standpoint.  We reviewed his testing results.  All his questions has been answered. Hyperlipidemia diet modification. He did tell me the chest pain and symptoms of presyncope have improved.  Sometimes he has intermittent dizziness which he attributes to stress.  At this time no further cardiovascular work-up needs to be performed.  I suspect her dyspnea may be related to underlying vestibular conditions. The patient is in agreement with the above plan. The patient left the office in stable  condition.  The patient will follow up as needed.   Medication Adjustments/Labs and Tests Ordered: Current medicines are reviewed at length with the patient today.  Concerns regarding medicines are outlined above.  No orders of the defined types were placed in this encounter.  No orders of the defined types were placed in this encounter.   Patient Instructions  Medication Instructions:  Your physician recommends that you continue on your current medications as directed. Please refer to the Current Medication list given to you today.  *If you need a refill on your cardiac medications before your next appointment, please call your pharmacy*   Lab Work: None If you have labs (blood work) drawn today and your tests are completely normal, you will receive your results only by: Marland Kitchen MyChart Message (if you have MyChart) OR . A paper copy in the mail If you have any lab test that is abnormal or we need to change your treatment, we will call you to review the results.   Testing/Procedures: None   Follow-Up: At [facility], you and your health needs are our priority.  As part of our continuing mission to provide you with exceptional heart care, we have created designated Provider Care Teams.  These Care Teams include your primary Cardiologist (physician) and Advanced Practice Providers (APPs -  Physician Assistants and Nurse Practitioners) who all work together to provide you with the care you need, when you need it.  We recommend signing up for the patient portal called "MyChart".  Sign up information is provided on this After Visit Summary.  MyChart is used to connect with patients for Virtual Visits (Telemedicine).  Patients are able to view lab/test results, encounter notes, upcoming appointments, etc.  Non-urgent messages can be sent to your provider as well.   To learn more about what you can do with MyChart, go to [email or web address].    Your next appointment:   As needed  The  format for your next appointment:   In Person  Provider:   Thomasene Ripple, DO   Other Instructions      Adopting a Healthy Lifestyle.  Know what a healthy weight is for you (roughly BMI <25) and aim to maintain this   Aim for 7+ servings of fruits and vegetables daily   65-80+ fluid ounces of water or unsweet tea for healthy kidneys   Limit to max 1 drink of alcohol per day; avoid smoking/tobacco   Limit animal fats in diet for cholesterol and heart health - choose grass fed whenever  available   Avoid highly processed foods, and foods high in saturated/trans fats   Aim for low stress - take time to unwind and care for your mental health   Aim for 150 min of moderate intensity exercise weekly for heart health, and weights twice weekly for bone health   Aim for 7-9 hours of sleep daily   When it comes to diets, agreement about the perfect plan isnt easy to find, even among the experts. Experts at the [facility] [employer] developed an idea known as the Healthy Eating Plate. Just imagine a plate divided into logical, healthy portions.   The emphasis is on diet quality:   Load up on vegetables and fruits - one-half of your plate: Aim for color and variety, and remember that potatoes dont count.   Go for whole grains - one-quarter of your plate: Whole wheat, barley, wheat berries, quinoa, oats, brown rice, and foods made with them. If you want pasta, go with whole wheat pasta.   Protein power - one-quarter of your plate: Fish, chicken, beans, and nuts are all healthy, versatile protein sources. Limit red meat.   The diet, however, does go beyond the plate, offering a few other suggestions.   Use healthy plant oils, such as olive, canola, soy, corn, sunflower and peanut. Check the labels, and avoid partially hydrogenated oil, which have unhealthy trans fats.   If youre thirsty, drink water. Coffee and tea are good in moderation, but skip sugary drinks and limit milk  and dairy products to one or two daily servings.   The type of carbohydrate in the diet is more important than the amount. Some sources of carbohydrates, such as vegetables, fruits, whole grains, and beans-are healthier than others.   Finally, stay active  Signed, Thomasene Ripple, DO  01/16/2020 8:40 AM    [facility]

## 2020-10-22 IMAGING — CT CT HEART MORP W/ CTA COR W/ SCORE W/ CA W/CM &/OR W/O CM
4 of 7 series · 8 of 20 positions shown, 9 images · IV contrast (APPLIED)
Comparison: None.
COMPARISON: None.

Addendum:
EXAM:
OVER-READ INTERPRETATION  CT CHEST

The following report is an over-read performed by radiologist Dr.
Chris-Jan Brader [REDACTED] on 10/04/2019. This
over-read does not include interpretation of cardiac or coronary
anatomy or pathology. The coronary calcium score/coronary CTA
interpretation by the cardiologist is attached.
CLINICAL DATA: 39 year old male with chest pain.
Cardiac/Coronary  CT
TECHNIQUE: The patient was scanned on a Phillips Force scanner.

[Series 6: best diast 73 % · axial · 0.39mm/px · z∈[-248,-202]mm · 2 of 346 slices shown]
[im 116/346  vessel]
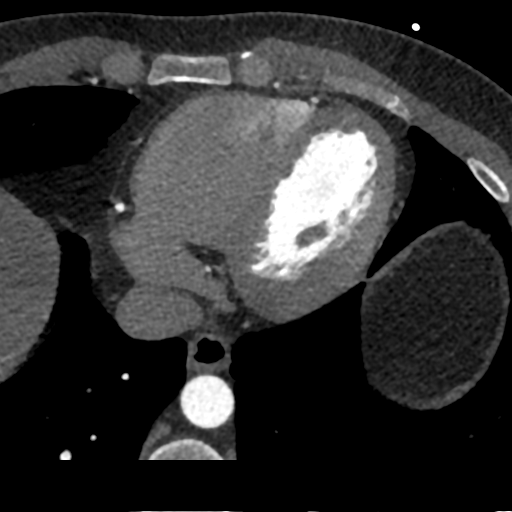
[im 231/346  vessel]
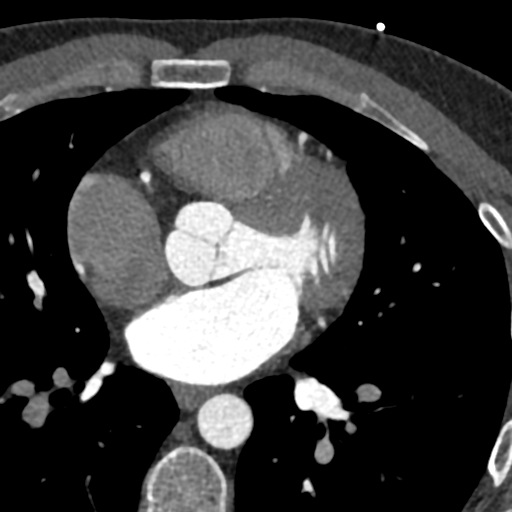

[Series 7: best syst · axial · 0.39mm/px · z∈[-248,-202]mm · 2 of 346 slices shown, 3 images]
[im 116/346  vessel]
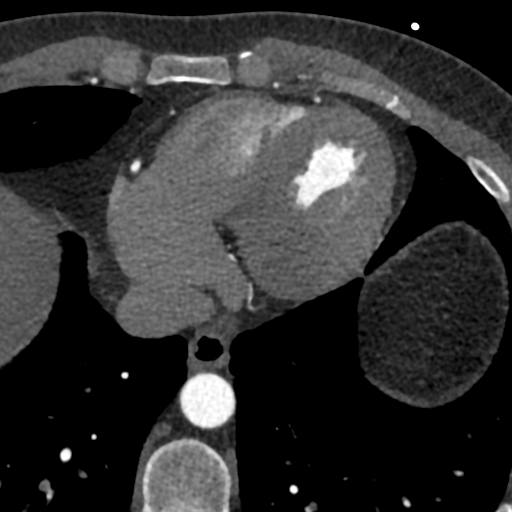
[im 116/346  lung]
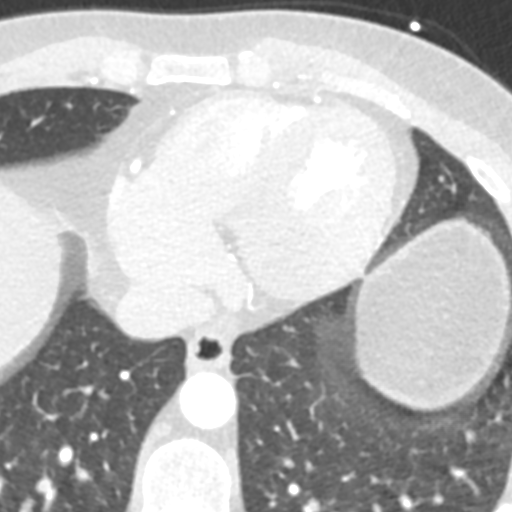
[im 231/346  vessel]
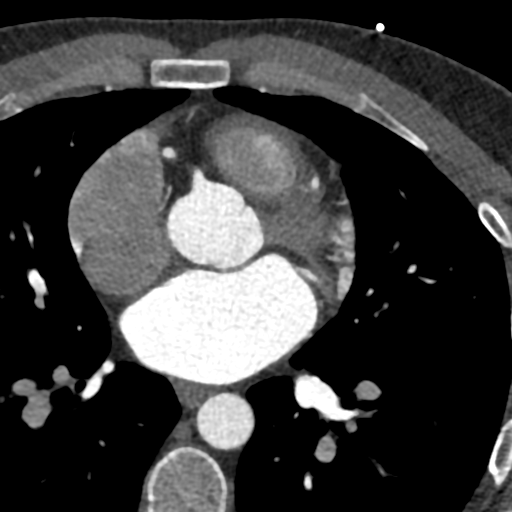

[Series 9: ts diast sharp · axial · 0.39mm/px · z∈[-248,-202]mm · 2 of 346 slices shown]
[im 116/346  lung]
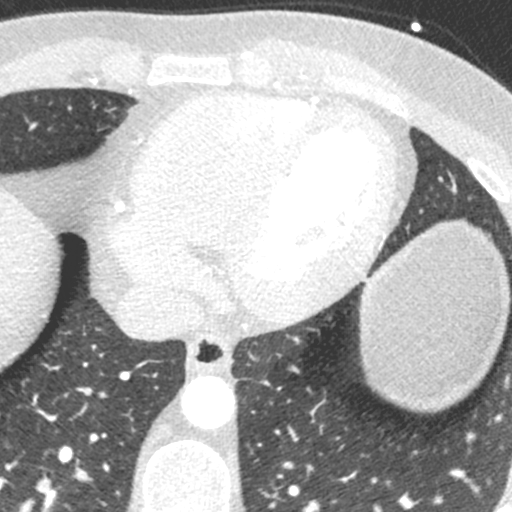
[im 231/346  lung]
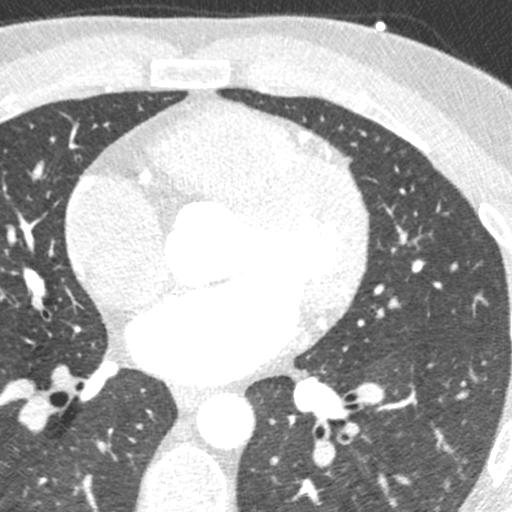

[Series 10: ts syst sharp · axial · 0.39mm/px · z∈[-248,-202]mm · 2 of 346 slices shown]
[im 116/346  lung]
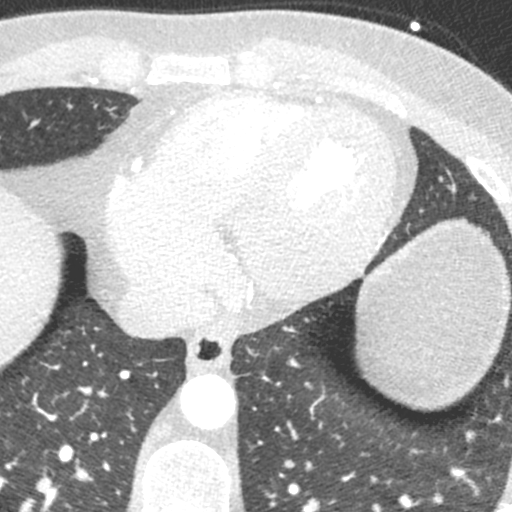
[im 231/346  lung]
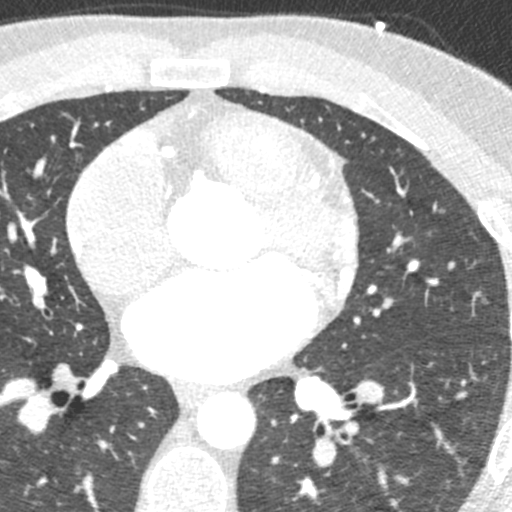

[8 of 20 positions shown; findings below may reference images not displayed]

FINDINGS: Within the visualized portions of the thorax there are no suspicious
appearing pulmonary nodules or masses, there is no acute
consolidative airspace disease, no pleural effusions, no
pneumothorax and no lymphadenopathy. Visualized portions of the
upper abdomen demonstrates a 1.3 cm low-attenuation lesion in
segment 7 of the liver, compatible with a simple cyst. There are no
aggressive appearing lytic or blastic lesions noted in the
visualized portions of the skeleton.
IMPRESSION: 1. No significant incidental noncardiac findings are noted.
FINDINGS: A 120 kV prospective scan was triggered in the descending thoracic
aorta at 111 HU's. Axial non-contrast 3 mm slices were carried out
through the heart. The data set was analyzed on a dedicated work
station and scored using the Agatson method. Gantry rotation speed
was 250 msecs and collimation was .6 mm. No beta blockade and 0.8 mg
of sl NTG was given. The 3D data set was reconstructed in 5%
intervals of the 67-82 % of the R-R cycle. Diastolic phases were
analyzed on a dedicated work station using MPR, MIP and VRT modes.
The patient received 80 cc of contrast.

Aorta: Normal size.  No calcifications.  No dissection.

Aortic Valve:  Trileaflet.  No calcifications.

Coronary Arteries:  Normal coronary origin.  Right dominance.

RCA is a large dominant artery that gives rise to PDA and PLVB.
There is no plaque.

Left main is a large artery that gives rise to LAD and LCX arteries.

LAD is a large vessel that has no plaque.

LCX is a non-dominant artery that gives rise to one large OM1
branch. There is no plaque.

Other findings:

Normal pulmonary vein drainage into the left atrium.

Normal left atrial appendage without a thrombus.

Normal size of the pulmonary artery.
IMPRESSION: 1. Coronary calcium score of 0. This was 0 percentile for age and
sex matched control.

2. Normal coronary origin with right dominance.

3. No evidence of CAD.

Housseyn Taane, DO

*** End of Addendum ***
EXAM:
OVER-READ INTERPRETATION  CT CHEST

The following report is an over-read performed by radiologist Dr.
Chris-Jan Brader [REDACTED] on 10/04/2019. This
over-read does not include interpretation of cardiac or coronary
anatomy or pathology. The coronary calcium score/coronary CTA
interpretation by the cardiologist is attached.
FINDINGS: Within the visualized portions of the thorax there are no suspicious
appearing pulmonary nodules or masses, there is no acute
consolidative airspace disease, no pleural effusions, no
pneumothorax and no lymphadenopathy. Visualized portions of the
upper abdomen demonstrates a 1.3 cm low-attenuation lesion in
segment 7 of the liver, compatible with a simple cyst. There are no
aggressive appearing lytic or blastic lesions noted in the
visualized portions of the skeleton.
IMPRESSION: 1. No significant incidental noncardiac findings are noted.
